# Patient Record
Sex: Male | Born: 2000 | Race: Black or African American | Hispanic: No | Marital: Single | Smoking: Never smoker
Health system: Southern US, Community
[De-identification: ages and names within clinical notes are randomized; demographics above are authoritative.]

---
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
## 2019-02-06 IMAGING — DX DG ABDOMEN 1V
1 series · 1 of 1 positions shown · non-contrast
Comparison: None.

CLINICAL DATA: Abdominal cramping and generalized pain.

EXAM:
ABDOMEN - 1 VIEW

[abdomen kub]
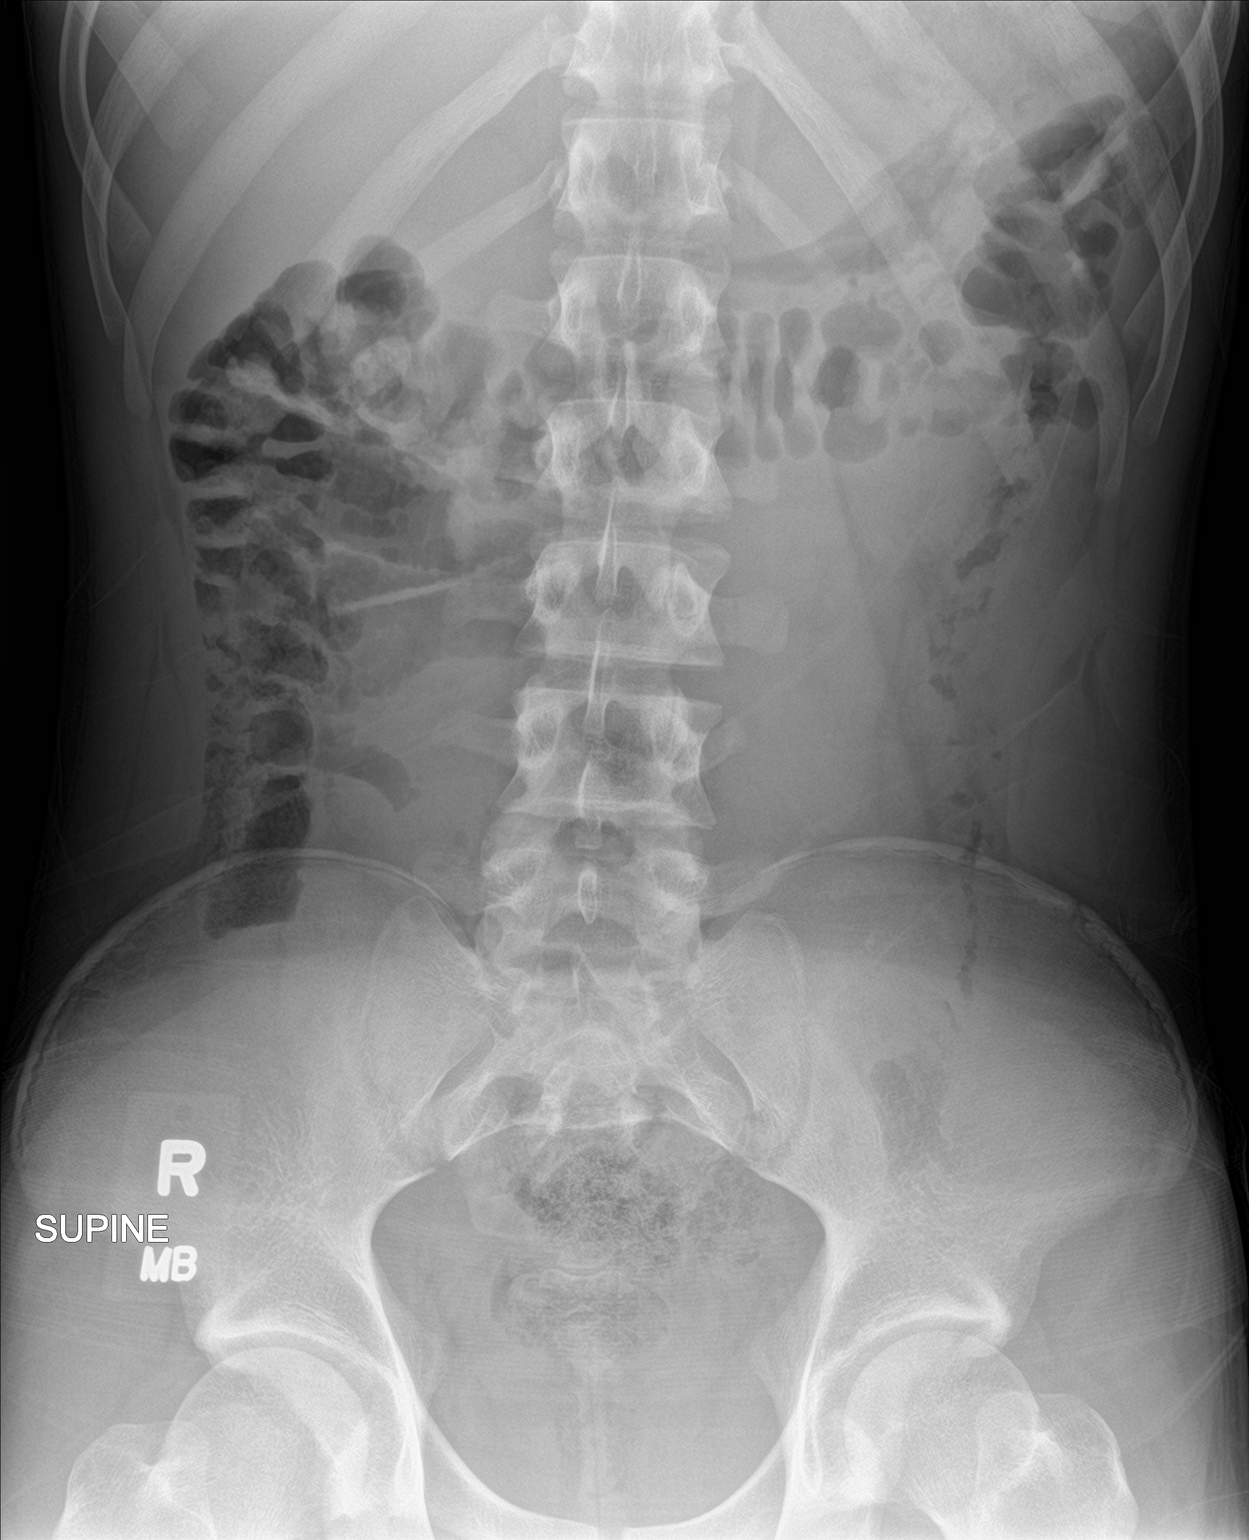

[1 of 1 positions shown; findings below may reference images not displayed]

FINDINGS: Mildly dilated small bowel loop in the right abdomen is nonspecific.
Gas and stool in the colon. Stool in the rectum. No large abdominal
calcifications. No suspicious bone findings.
IMPRESSION: Mildly dilated small bowel in the right upper abdomen. Findings are
nonspecific.

Moderate stool burden.

## 2020-02-13 ENCOUNTER — Encounter (HOSPITAL_COMMUNITY): Payer: Self-pay

## 2020-02-13 ENCOUNTER — Emergency Department (HOSPITAL_COMMUNITY)
Admission: EM | Admit: 2020-02-13 | Discharge: 2020-02-13 | Disposition: A | Discharge status: Home / Self Care | Payer: Medicaid Other | Attending: Emergency Medicine | Admitting: Emergency Medicine

## 2020-02-13 ENCOUNTER — Other Ambulatory Visit: Payer: Self-pay

## 2020-02-13 DIAGNOSIS — J029 Acute pharyngitis, unspecified: Secondary | ICD-10-CM | POA: Insufficient documentation

## 2020-02-13 DIAGNOSIS — Z20822 Contact with and (suspected) exposure to covid-19: Secondary | ICD-10-CM | POA: Diagnosis not present

## 2020-02-13 DIAGNOSIS — M791 Myalgia, unspecified site: Secondary | ICD-10-CM | POA: Diagnosis not present

## 2020-02-13 LAB — SARS CORONAVIRUS 2 BY RT PCR (HOSPITAL ORDER, PERFORMED IN ~~LOC~~ HOSPITAL LAB): SARS Coronavirus 2: NEGATIVE

## 2020-02-13 LAB — GROUP A STREP BY PCR: Group A Strep by PCR: NOT DETECTED

## 2020-02-13 NOTE — Discharge Instructions (Signed)
General Viral Syndrome Care Instructions:  The strep and Covid tests were negative.  Your symptoms are likely consistent with a viral illness. Viruses do not require or respond to antibiotics. Treatment is symptomatic care and it is important to note that these symptoms may last for 7-14 days.   Hand washing: Wash your hands throughout the day, but especially before and after touching the face, using the restroom, sneezing, coughing, or touching surfaces that have been coughed or sneezed upon. Hydration: Symptoms of most illnesses will be intensified and complicated by dehydration. Dehydration can also extend the duration of symptoms. Drink plenty of fluids and get plenty of rest. You should be drinking at least half a liter of water an hour to stay hydrated. Electrolyte drinks (ex. Gatorade, Powerade, Pedialyte) are also encouraged. You should be drinking enough fluids to make your urine light yellow, almost clear. If this is not the case, you are not drinking enough water. Please note that some of the treatments indicated below will not be effective if you are not adequately hydrated. Diet: Please concentrate on hydration, however, you may introduce food slowly.  Start with a clear liquid diet, progressed to a full liquid diet, and then bland solids as you are able. Pain or fever: Ibuprofen, Naproxen, or acetaminophen (generic for Tylenol) for pain or fever.  Antiinflammatory medications: Take 600 mg of ibuprofen every 6 hours or 440 mg (over the counter dose) to 500 mg (prescription dose) of naproxen every 12 hours for the next 3 days. After this time, these medications may be used as needed for pain. Take these medications with food to avoid upset stomach. Choose only one of these medications, do not take them together. Acetaminophen (generic for Tylenol): Should you continue to have additional pain while taking the ibuprofen or naproxen, you may add in acetaminophen as needed. Your daily total maximum  amount of acetaminophen from all sources should be limited to 4000mg /day for persons without liver problems, or 2000mg /day for those with liver problems. Zyrtec or Claritin: May add these medication daily to control underlying symptoms of congestion, sneezing, and other signs of allergies.  These medications are available over-the-counter. Generics: Cetirizine (generic for Zyrtec) and loratadine (generic for Claritin). Fluticasone: Use fluticasone (generic for Flonase), as directed, for nasal and sinus congestion.  This medication is available over-the-counter. Congestion: Plain guaifenesin (generic for plain Mucinex) may help relieve congestion. Saline sinus rinses and saline nasal sprays may also help relieve congestion. If you do not have high blood pressure, heart problems, or an allergy to such medications, you may also try phenylephrine or Sudafed. Sore throat: Warm liquids or Chloraseptic spray may help soothe a sore throat. Gargle twice a day with a salt water solution made from a half teaspoon of salt in a cup of warm water.  Follow up: Follow up with a primary care provider within the next two weeks should symptoms fail to resolve. Return: Return to the ED for significantly worsening symptoms, shortness of breath, persistent vomiting, large amounts of blood in stool, or any other major concerns.  For prescription assistance, may try using prescription discount sites or apps, such as goodrx.com

## 2020-02-13 NOTE — ED Triage Notes (Signed)
Patient arrived with complaints of generalized body aches and sore throat over the last two days.

## 2020-02-13 NOTE — ED Provider Notes (Signed)
[facility]-EMERGENCY DEPT Provider Note   CSN: [id number] Arrival date & time: 02/13/20  2015     History Chief Complaint  Patient presents with   Generalized Body Aches   Sore Throat    Tyler Berg is a 19 y.o. male.  HPI     Tyler Berg is a 19 y.o. male, patient with no known past medical history, presenting to the ED with sore throat beginning 2 days ago.  Accompanied by body aches, fatigue, and intermittent headache. Throat pain is bilateral, aching, moderate. Denies fever, cough, chest pain, shortness of breath, abdominal pain, N/V/D, urinary symptoms, neck stiffness, or any other complaints.   History reviewed. No pertinent past medical history.  There are no problems to display for this patient.   History reviewed. No pertinent surgical history.     No family history on file.  Social History   Tobacco Use   Smoking status: Never Smoker   Smokeless tobacco: Never Used  Substance Use Topics   Alcohol use: No   Drug use: No    Home Medications Prior to Admission medications   Medication Sig Start Date End Date Taking? Authorizing Provider  docusate sodium (COLACE) 50 MG capsule Take 1 capsule (50 mg total) by mouth 2 (two) times daily. 03/03/17   Wallis Bamberg, PA-C  ondansetron (ZOFRAN ODT) 4 MG disintegrating tablet Take 1 tablet (4 mg total) by mouth every 8 (eight) hours as needed for nausea. 02/27/17   Garlon Hatchet, PA-C  polyethylene glycol [facility]) packet Take 17 g by mouth daily as needed. 03/03/17   Wallis Bamberg, PA-C    Allergies    Patient has no known allergies.  Review of Systems   Review of Systems  Constitutional: Negative for chills and fever.  HENT: Positive for sore throat. Negative for trouble swallowing and voice change.   Respiratory: Negative for cough and shortness of breath.   Cardiovascular: Negative for chest pain.  Gastrointestinal: Negative for abdominal pain, diarrhea, nausea and vomiting.    Musculoskeletal: Positive for myalgias.  Neurological: Negative for dizziness and syncope.  All other systems reviewed and are negative.   Physical Exam Updated Vital Signs BP 129/88 (BP Location: Left Arm)    Pulse 74    Temp 98.4 F (36.9 C) (Oral)    Resp 18    Ht 5\' 7"  (1.702 m)    Wt 56.7 kg    SpO2 100%    BMI 19.58 kg/m   Physical Exam Vitals and nursing note reviewed.  Constitutional:      General: He is not in acute distress.    Appearance: He is well-developed. He is not diaphoretic.  HENT:     Head: Normocephalic and atraumatic.     Mouth/Throat:     Mouth: Mucous membranes are moist.     Pharynx: Oropharynx is clear. Posterior oropharyngeal erythema present. No oropharyngeal exudate.  Eyes:     Conjunctiva/sclera: Conjunctivae normal.  Cardiovascular:     Rate and Rhythm: Normal rate and regular rhythm.     Pulses: Normal pulses.          Radial pulses are 2+ on the right side and 2+ on the left side.  Pulmonary:     Effort: Pulmonary effort is normal. No respiratory distress.     Breath sounds: Normal breath sounds.  Abdominal:     Palpations: Abdomen is soft.     Tenderness: There is no abdominal tenderness. There is no guarding.  Musculoskeletal:  Cervical back: Neck supple.  Lymphadenopathy:     Cervical: No cervical adenopathy.  Skin:    General: Skin is warm and dry.  Neurological:     Mental Status: He is alert.     Comments: No noted acute cognitive deficit. Motor function intact in all 4 extremities.   No gait disturbance.  Coordination intact.  Handles oral secretions without noted difficulty.  No noted phonation or speech deficit.  Psychiatric:        Mood and Affect: Mood and affect normal.        Speech: Speech normal.        Behavior: Behavior normal.     ED Results / Procedures / Treatments   Labs (all labs ordered are listed, but only abnormal results are displayed) Labs Reviewed  SARS CORONAVIRUS 2 BY RT PCR (HOSPITAL ORDER,  PERFORMED IN [facility])  GROUP A STREP BY PCR    EKG None  Radiology No results found.  Procedures Procedures (including critical care time)  Medications Ordered in ED Medications - No data to display  ED Course  I have reviewed the triage vital signs and the nursing notes.  Pertinent labs & imaging results that were available during my care of the patient were reviewed by me and considered in my medical decision making (see chart for details).    MDM Rules/Calculators/A&P                          Patient presents with sore throat and body aches for the last couple days. Patient is nontoxic appearing, afebrile, not tachycardic, not tachypneic, not hypotensive, maintains excellent SPO2 on room air, and is in no apparent distress.  Covid and strep are negative. The patient was given instructions for home care as well as return precautions. Patient voices understanding of these instructions, accepts the plan, and is comfortable with discharge.   Final Clinical Impression(s) / ED Diagnoses Final diagnoses:  Sore throat    Rx / DC Orders ED Discharge Orders    None       Concepcion Living 02/14/20 0256    Charlynne Pander, MD 02/17/20 [phone]

## 2020-05-02 ENCOUNTER — Encounter (HOSPITAL_COMMUNITY): Payer: Self-pay | Admitting: Emergency Medicine

## 2020-05-02 ENCOUNTER — Emergency Department (HOSPITAL_COMMUNITY)
Admission: EM | Admit: 2020-05-02 | Discharge: 2020-05-02 | Disposition: A | Discharge status: Home / Self Care | Payer: Medicaid Other | Attending: Emergency Medicine | Admitting: Emergency Medicine

## 2020-05-02 ENCOUNTER — Other Ambulatory Visit: Payer: Self-pay

## 2020-05-02 DIAGNOSIS — M545 Low back pain, unspecified: Secondary | ICD-10-CM | POA: Insufficient documentation

## 2020-05-02 DIAGNOSIS — Z5321 Procedure and treatment not carried out due to patient leaving prior to being seen by health care provider: Secondary | ICD-10-CM | POA: Insufficient documentation

## 2020-05-02 DIAGNOSIS — R300 Dysuria: Secondary | ICD-10-CM | POA: Insufficient documentation

## 2020-05-02 LAB — URINALYSIS, ROUTINE W REFLEX MICROSCOPIC
Bilirubin Urine: NEGATIVE
Glucose, UA: NEGATIVE mg/dL
Hgb urine dipstick: NEGATIVE
Ketones, ur: NEGATIVE mg/dL
Nitrite: NEGATIVE
Protein, ur: NEGATIVE mg/dL
Specific Gravity, Urine: 1.02 (ref 1.005–1.030)
pH: 6 (ref 5.0–8.0)

## 2020-05-02 NOTE — ED Triage Notes (Signed)
Patient complains of burning w/ urination x3 days, but it is not constant. Denies new sexual partners, discharge or sores. Endorses bilateral lower back pain x1 week.

## 2021-02-26 DIAGNOSIS — Z113 Encounter for screening for infections with a predominantly sexual mode of transmission: Secondary | ICD-10-CM | POA: Diagnosis not present

## 2021-02-26 DIAGNOSIS — Z114 Encounter for screening for human immunodeficiency virus [HIV]: Secondary | ICD-10-CM | POA: Diagnosis not present

## 2022-03-14 DIAGNOSIS — Z114 Encounter for screening for human immunodeficiency virus [HIV]: Secondary | ICD-10-CM | POA: Diagnosis not present

## 2022-03-14 DIAGNOSIS — N341 Nonspecific urethritis: Secondary | ICD-10-CM | POA: Diagnosis not present

## 2022-03-14 DIAGNOSIS — Z113 Encounter for screening for infections with a predominantly sexual mode of transmission: Secondary | ICD-10-CM | POA: Diagnosis not present

## 2022-04-01 DIAGNOSIS — Z113 Encounter for screening for infections with a predominantly sexual mode of transmission: Secondary | ICD-10-CM | POA: Diagnosis not present

## 2022-04-01 DIAGNOSIS — N341 Nonspecific urethritis: Secondary | ICD-10-CM | POA: Diagnosis not present

## 2022-04-01 DIAGNOSIS — Z114 Encounter for screening for human immunodeficiency virus [HIV]: Secondary | ICD-10-CM | POA: Diagnosis not present

## 2022-07-23 DIAGNOSIS — Z202 Contact with and (suspected) exposure to infections with a predominantly sexual mode of transmission: Secondary | ICD-10-CM | POA: Diagnosis not present

## 2022-07-23 DIAGNOSIS — N341 Nonspecific urethritis: Secondary | ICD-10-CM | POA: Diagnosis not present

## 2022-07-23 DIAGNOSIS — Z114 Encounter for screening for human immunodeficiency virus [HIV]: Secondary | ICD-10-CM | POA: Diagnosis not present

## 2022-07-23 DIAGNOSIS — Z113 Encounter for screening for infections with a predominantly sexual mode of transmission: Secondary | ICD-10-CM | POA: Diagnosis not present

## 2023-03-04 DIAGNOSIS — Z114 Encounter for screening for human immunodeficiency virus [HIV]: Secondary | ICD-10-CM | POA: Diagnosis not present

## 2023-03-04 DIAGNOSIS — Z113 Encounter for screening for infections with a predominantly sexual mode of transmission: Secondary | ICD-10-CM | POA: Diagnosis not present
